# Patient Record
Sex: Male | Born: 1965 | Race: White | Hispanic: No | Marital: Single | Smoking: Current every day smoker
Health system: Southern US, Community
[De-identification: ages and names within clinical notes are randomized; demographics above are authoritative.]

## PROBLEM LIST (undated history)

## (undated) HISTORY — PX: NECK SURGERY: SHX720

---

## 2006-08-18 ENCOUNTER — Ambulatory Visit: Payer: Self-pay | Admitting: Family Medicine

## 2006-10-03 DIAGNOSIS — D239 Other benign neoplasm of skin, unspecified: Secondary | ICD-10-CM | POA: Insufficient documentation

## 2006-10-20 ENCOUNTER — Ambulatory Visit: Payer: Self-pay | Admitting: Family Medicine

## 2006-10-20 LAB — CONVERTED CEMR LAB
ALT: 12 units/L (ref 0–53)
AST: 20 units/L (ref 0–37)
Basophils Relative: 1.1 % — ABNORMAL HIGH (ref 0.0–1.0)
Bilirubin, Direct: 0.1 mg/dL (ref 0.0–0.3)
CO2: 30 meq/L (ref 19–32)
Calcium: 8.9 mg/dL (ref 8.4–10.5)
Eosinophils Relative: 1.6 % (ref 0.0–5.0)
GFR calc Af Amer: 66 mL/min
Glucose, Bld: 84 mg/dL (ref 70–99)
HCT: 38.9 % — ABNORMAL LOW (ref 39.0–52.0)
Hemoglobin: 13.6 g/dL (ref 13.0–17.0)
Lymphocytes Relative: 29.7 % (ref 12.0–46.0)
Monocytes Absolute: 0.5 10*3/uL (ref 0.2–0.7)
Neutro Abs: 2.6 10*3/uL (ref 1.4–7.7)
Platelets: 357 10*3/uL (ref 150–400)
Total Protein: 6.1 g/dL (ref 6.0–8.3)
VLDL: 13 mg/dL (ref 0–40)
WBC: 4.7 10*3/uL (ref 4.5–10.5)

## 2006-10-27 ENCOUNTER — Ambulatory Visit: Payer: Self-pay | Admitting: Family Medicine

## 2006-10-27 DIAGNOSIS — F17200 Nicotine dependence, unspecified, uncomplicated: Secondary | ICD-10-CM | POA: Insufficient documentation

## 2006-12-28 ENCOUNTER — Ambulatory Visit: Payer: Self-pay | Admitting: Family Medicine

## 2008-01-21 ENCOUNTER — Ambulatory Visit: Payer: Self-pay | Admitting: Family Medicine

## 2008-01-21 DIAGNOSIS — M719 Bursopathy, unspecified: Secondary | ICD-10-CM

## 2008-01-21 DIAGNOSIS — M67919 Unspecified disorder of synovium and tendon, unspecified shoulder: Secondary | ICD-10-CM | POA: Insufficient documentation

## 2008-08-05 ENCOUNTER — Telehealth: Payer: Self-pay | Admitting: *Deleted

## 2008-08-05 ENCOUNTER — Ambulatory Visit: Payer: Self-pay | Admitting: Family Medicine

## 2008-08-05 DIAGNOSIS — M549 Dorsalgia, unspecified: Secondary | ICD-10-CM | POA: Insufficient documentation

## 2008-08-05 DIAGNOSIS — J45909 Unspecified asthma, uncomplicated: Secondary | ICD-10-CM | POA: Insufficient documentation

## 2008-08-05 DIAGNOSIS — R81 Glycosuria: Secondary | ICD-10-CM

## 2008-08-05 DIAGNOSIS — J111 Influenza due to unidentified influenza virus with other respiratory manifestations: Secondary | ICD-10-CM | POA: Insufficient documentation

## 2008-08-05 LAB — CONVERTED CEMR LAB
Glucose, Urine, Semiquant: 100
Nitrite: NEGATIVE
Specific Gravity, Urine: 1.025
pH: 6

## 2008-09-18 ENCOUNTER — Telehealth: Payer: Self-pay | Admitting: Family Medicine

## 2008-10-07 ENCOUNTER — Ambulatory Visit (HOSPITAL_COMMUNITY): Admission: RE | Admit: 2008-10-07 | Discharge: 2008-10-08 | Payer: Self-pay | Admitting: Neurosurgery

## 2008-10-31 ENCOUNTER — Emergency Department (HOSPITAL_COMMUNITY): Admission: EM | Admit: 2008-10-31 | Discharge: 2008-10-31 | Payer: Self-pay | Admitting: Emergency Medicine

## 2008-12-25 ENCOUNTER — Encounter: Payer: Self-pay | Admitting: Family Medicine
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
## 2010-07-11 LAB — CBC
HCT: 41.5 % (ref 39.0–52.0)
MCHC: 34.6 g/dL (ref 30.0–36.0)
MCV: 95.5 fL (ref 78.0–100.0)
Platelets: 308 10*3/uL (ref 150–400)
RDW: 13.7 % (ref 11.5–15.5)

## 2010-07-11 LAB — URINALYSIS, ROUTINE W REFLEX MICROSCOPIC
Bilirubin Urine: NEGATIVE
Ketones, ur: 15 mg/dL — AB
Nitrite: NEGATIVE
Urobilinogen, UA: 0.2 mg/dL (ref 0.0–1.0)

## 2010-08-17 NOTE — Op Note (Signed)
NAMEJORAM, VENSON            ACCOUNT NO.:  [id number]   MEDICAL RECORD NO.:  [id number]          PATIENT TYPE:  OIB   LOCATION:  3524                         FACILITY:  [facility]   PHYSICIAN:  Coletta Memos, M.D.     DATE OF BIRTH:  November 04, 1965   DATE OF PROCEDURE:  10/07/2008  DATE OF DISCHARGE:  10/08/2008                               OPERATIVE REPORT   PREOPERATIVE DIAGNOSES:  1. Displaced disk, C5-6 right.  2. Right C6 radiculopathy.   POSTOPERATIVE DIAGNOSES:  1. Displaced disk, C5-6 right.  2. Right C6 radiculopathy.   PROCEDURES:  1. Anterior cervical decompression, C5-6.  2. Arthrodesis, C5-6 with 7-mm structural allograft.  3. Anterior instrumentation, Vectra plating.   COMPLICATIONS:  None.   SURGEON:  Coletta Memos, MD   ASSISTANT:  None.   INDICATIONS:  Mr. Cromartie presented with profound pain and weakness in  his right upper extremity, both triceps and biceps.  MRI revealed  herniated disk on the right side at C5-6.  I, therefore, offered and he  agreed to operative decompression.   OPERATIVE NOTE:  Mr. Springsteen was taken to the operating room,  intubated, and placed under a general anesthetic without difficulty.  His head was placed in neutral position on a horseshoe headrest.  His  neck was then prepped and he was draped in a sterile fashion.  I  infiltrated 2 mL of 0.5% lidocaine with 1:200,000 strength epinephrine  into the cervical region starting from the midline extending to the  medial border of the left sternocleidomastoid muscle.  I opened the skin  with a #10 blade and took this down to the platysma.  I then opened the  platysma in a horizontal fashion using Metzenbaum scissors.  I then  dissected in both sharp and blunt fashion to create an avascular  corridor to the cervical spine.  I placed a spinal needle, assured I was  at the correct level the C5-6.  I then reflected the longus colli  muscles bilaterally using monopolar cautery.  I then  placed self-  retaining retractors.  I then placed distraction pins one at C5 and one  at C6.  I opened the disk space with a #15 blade, distracted the disk  space, and then proceeded with the decompression.   Using the microscope, high-speed drill, Kerrison punches, and pituitary  rongeurs, I completed the diskectomy at C5-6 and the decompression of  the spinal canal at that level.  I used a high-speed drill to remove  osteophytes.  I was on the right side able to move very large pieces of  disk material.  There was a great deal of epidural vasculature present  on both the right and left sides.  I was able to get through this on the  right side as it was necessary to ensure that there were no more disk  fragments.  On the left side, not necessary, as he had absolutely no  problems on the left side.  Decompression having been done I turned to  the arthrodesis.  I used a high-speed drill to even out the surfaces at  C5  and the C6.  I then incised the space and felt a 7-mm structural  allograft would fit.  I placed a graft without difficulty.  I then  turned my attention to the instrumentation.  I placed a 16-mm plate at  Z6-1, placing 4 screws, two screws in C5 and two in C6.  I drilled these  hole and used self-tapping screws.  The plate was placed without  difficulty.  X-ray showed the plate and plugs were in good position.  I  then irrigated the wound.  I then closed the wound in layered fashion  using Vicryl sutures to reapproximate the platysma and the subcuticular  layers.  I used Dermabond for sterile dressing.  Mr. Detweiler awoke,  was moving all extremities, and had a strong voice.  He did this after  being extubated.           ______________________________  Coletta Memos, M.D.     KC/MEDQ  D:  10/09/2008  T:  10/10/2008  Job:  [id number]

## 2013-04-03 ENCOUNTER — Emergency Department (HOSPITAL_COMMUNITY)
Admission: EM | Admit: 2013-04-03 | Discharge: 2013-04-03 | Disposition: A | Discharge status: Home / Self Care | Payer: BC Managed Care – PPO | Source: Home / Self Care | Attending: Emergency Medicine | Admitting: Emergency Medicine

## 2013-04-03 ENCOUNTER — Encounter (HOSPITAL_COMMUNITY): Payer: Self-pay | Admitting: Emergency Medicine

## 2013-04-03 ENCOUNTER — Telehealth: Payer: Self-pay | Admitting: Family Medicine

## 2013-04-03 DIAGNOSIS — J069 Acute upper respiratory infection, unspecified: Secondary | ICD-10-CM

## 2013-04-03 DIAGNOSIS — J019 Acute sinusitis, unspecified: Secondary | ICD-10-CM

## 2013-04-03 DIAGNOSIS — J209 Acute bronchitis, unspecified: Secondary | ICD-10-CM

## 2013-04-03 MED ORDER — BENZONATATE 200 MG PO CAPS: 200.0000 mg | ORAL_CAPSULE | Freq: Three times a day (TID) | ORAL | Status: AC | PRN

## 2013-04-03 MED ORDER — CEFDINIR 300 MG PO CAPS: 300.0000 mg | ORAL_CAPSULE | Freq: Two times a day (BID) | ORAL | Status: AC

## 2013-04-03 NOTE — ED Notes (Signed)
Uri: onset 12/25.  Initially sore throat/sinus congestion and stuffiness, now congestion working into chest.  C/o right ear pain.

## 2013-04-03 NOTE — Telephone Encounter (Signed)
Spoke with patient and advised him to go to Urgent Care and to call back and reschedule as a new patient.

## 2013-04-03 NOTE — ED Provider Notes (Signed)
  Chief Complaint   Chief Complaint  Patient presents with  . URI    History of Present Illness   Jared Lamb is a 47 year old male who presents with a three-day history of sweats, subjective fever, chills, and fatigue. He's been exposed to a flulike illness. He also has nasal congestion with yellow-green rhinorrhea, headache, ear congestion, watery eyes, postnasal drip, scratchy throat, hoarseness, cough productive yellow-green sputum, and rattling in his chest. He smokes half pack of cigarettes per day.  Review of Systems   Other than as noted above, the patient denies any of the following symptoms: Systemic:  No fevers, chills, sweats, or myalgias. Eye:  No redness or discharge. ENT:  No ear pain, headache, nasal congestion, drainage, sinus pressure, or sore throat. Neck:  No neck pain, stiffness, or swollen glands. Lungs:  No cough, sputum production, hemoptysis, wheezing, chest tightness, shortness of breath or chest pain. GI:  No abdominal pain, nausea, vomiting or diarrhea.  PMFSH   Past medical history, family history, social history, meds, and allergies were reviewed.  Physical exam   Vital signs:  BP 155/92  Pulse 80  Temp(Src) 98.6 F (37 C) (Oral)  Resp 20  SpO2 96% General:  Alert and oriented.  In no distress.  Skin warm and dry. Eye:  No conjunctival injection or drainage. Lids were normal. ENT:  TMs and canals were normal, without erythema or inflammation.  Nasal mucosa was clear and uncongested, without drainage.  Mucous membranes were moist.  Pharynx was clear with no exudate or drainage.  There were no oral ulcerations or lesions. Neck:  Supple, no adenopathy, tenderness or mass. Lungs:  No respiratory distress.  Lungs were clear to auscultation, without wheezes, rales or rhonchi.  Breath sounds were clear and equal bilaterally.  Heart:  Regular rhythm, without gallops, murmers or rubs. Skin:  Clear, warm, and dry, without rash or  lesions.  Assessment     The primary encounter diagnosis was Viral upper respiratory infection. Diagnoses of Acute bronchitis and Acute sinusitis were also pertinent to this visit.  Plan    1.  Meds:  The following meds were prescribed:   Discharge Medication List as of 04/03/2013  4:57 PM    START taking these medications   Details  benzonatate (TESSALON) 200 MG capsule Take 1 capsule (200 mg total) by mouth 3 (three) times daily as needed for cough., Starting 04/03/2013, Until Discontinued, Normal    cefdinir (OMNICEF) 300 MG capsule Take 1 capsule (300 mg total) by mouth 2 (two) times daily., Starting 04/03/2013, Until Discontinued, Normal        2.  Patient Education/Counseling:  The patient was given appropriate handouts, self care instructions, and instructed in symptomatic relief.  Instructed to get extra fluids, rest, and use a cool mist vaporizer.   3.  Follow up:  The patient was told to follow up here if no better in 3 to 4 days, or sooner if becoming worse in any way, and given some red flag symptoms such as increasing fever, difficulty breathing, chest pain, or persistent vomiting which would prompt immediate return.  Follow up here as needed.      Reuben Likes, MD 04/03/13 [phone]

## 2013-04-03 NOTE — Telephone Encounter (Signed)
Pt called and requested to see dr. Tawanna Lamb today, he believes he has the flu. He also has not been seen sicne 2010. Please advise.

## 2015-01-19 ENCOUNTER — Telehealth: Payer: Self-pay | Admitting: Family Medicine

## 2015-01-19 NOTE — Telephone Encounter (Signed)
Please inform patient that Dr Sherren Mocha is not taking new patients at this time.  He has been referring his patient to [place].  thanks

## 2015-01-19 NOTE — Telephone Encounter (Signed)
Pt was last seen by dr todd around 2010. Pt had neck surgery in past. Pt now has blood in stool. Pt would like to re-est with dr todd, I inform the patient dr todd is not taking new pt and that md only works limited days.

## 2015-01-20 NOTE — Telephone Encounter (Signed)
Pt is aware of message

## 2016-06-29 DIAGNOSIS — F411 Generalized anxiety disorder: Secondary | ICD-10-CM | POA: Diagnosis not present

## 2016-12-21 DIAGNOSIS — F411 Generalized anxiety disorder: Secondary | ICD-10-CM | POA: Diagnosis not present

## 2017-06-14 DIAGNOSIS — F410 Panic disorder [episodic paroxysmal anxiety] without agoraphobia: Secondary | ICD-10-CM | POA: Diagnosis not present

## 2017-12-06 DIAGNOSIS — F411 Generalized anxiety disorder: Secondary | ICD-10-CM | POA: Diagnosis not present

## 2018-06-06 DIAGNOSIS — F411 Generalized anxiety disorder: Secondary | ICD-10-CM | POA: Diagnosis not present

## 2020-04-08 ENCOUNTER — Other Ambulatory Visit: Payer: Self-pay

## 2020-04-08 DIAGNOSIS — Z20822 Contact with and (suspected) exposure to covid-19: Secondary | ICD-10-CM

## 2020-04-10 LAB — NOVEL CORONAVIRUS, NAA: SARS-CoV-2, NAA: DETECTED — AB

## 2020-04-10 LAB — SARS-COV-2, NAA 2 DAY TAT
